# Patient Record
Sex: Female | Born: 1965 | Race: White | Hispanic: No | Marital: Single | Smoking: Current every day smoker
Health system: Southern US, Community
[De-identification: ages and names within clinical notes are randomized; demographics above are authoritative.]

## PROBLEM LIST (undated history)

## (undated) DIAGNOSIS — Z803 Family history of malignant neoplasm of breast: Secondary | ICD-10-CM

## (undated) HISTORY — PX: NO PAST SURGERIES: SHX2092

## (undated) HISTORY — DX: Family history of malignant neoplasm of breast: Z80.3

---

## 2004-07-01 ENCOUNTER — Ambulatory Visit: Payer: Self-pay | Admitting: Family Medicine

## 2005-08-18 ENCOUNTER — Ambulatory Visit: Payer: Self-pay | Admitting: Family Medicine

## 2005-09-04 ENCOUNTER — Ambulatory Visit: Payer: Self-pay | Admitting: Family Medicine

## 2007-06-03 ENCOUNTER — Ambulatory Visit: Payer: Self-pay | Admitting: Family Medicine

## 2008-10-24 ENCOUNTER — Ambulatory Visit: Payer: Self-pay | Admitting: Internal Medicine

## 2008-12-29 ENCOUNTER — Emergency Department: Payer: Self-pay | Admitting: Unknown Physician Specialty

## 2009-09-22 ENCOUNTER — Ambulatory Visit: Payer: Self-pay

## 2009-11-27 ENCOUNTER — Ambulatory Visit: Payer: Self-pay | Admitting: Internal Medicine

## 2010-10-25 ENCOUNTER — Ambulatory Visit: Payer: Self-pay

## 2010-10-27 ENCOUNTER — Ambulatory Visit: Payer: Self-pay

## 2011-05-01 ENCOUNTER — Ambulatory Visit: Payer: Self-pay

## 2011-11-27 ENCOUNTER — Ambulatory Visit: Payer: Self-pay | Admitting: Family Medicine

## 2012-12-05 ENCOUNTER — Ambulatory Visit: Payer: Self-pay | Admitting: Family Medicine

## 2013-01-08 ENCOUNTER — Ambulatory Visit: Payer: Self-pay

## 2013-01-20 ENCOUNTER — Ambulatory Visit: Payer: Self-pay

## 2014-02-04 ENCOUNTER — Ambulatory Visit: Payer: Self-pay

## 2015-02-01 ENCOUNTER — Ambulatory Visit
Admission: RE | Admit: 2015-02-01 | Discharge: 2015-02-01 | Disposition: A | Discharge status: Home / Self Care | Payer: Self-pay | Source: Ambulatory Visit | Attending: Oncology | Admitting: Oncology

## 2015-02-01 ENCOUNTER — Ambulatory Visit: Payer: Self-pay | Attending: Oncology

## 2015-02-01 VITALS — BP 123/84 | HR 66 | Temp 96.0°F | Resp 16 | Ht 68.11 in | Wt 191.4 lb

## 2015-02-01 DIAGNOSIS — Z0000 Encounter for general adult medical examination without abnormal findings: Secondary | ICD-10-CM

## 2015-02-01 NOTE — Progress Notes (Signed)
Subjective:     Patient ID: Diane Kennedy, female   DOB: [birth year], 49 y.o.   MRN: [id number]  HPI   Review of Systems     Objective:   Physical Exam  Pulmonary/Chest: Right breast exhibits no inverted nipple, no mass, no nipple discharge, no skin change and no tenderness. Left breast exhibits no inverted nipple, no mass, no nipple discharge, no skin change and no tenderness. Breasts are symmetrical.    Bilateral thickening upper outer quadrants.       Assessment:     49 year old patient presents for [facility] clinic visitPatient screened, and meets B[facility]CP eligibility.  Patient does not have insurance, Medicare or Medicaid.  Handout given on Affordable Care Act.CBE unremarkable.  Instructed patient on breast self-exam using teach back method.  Patient has 2 sisters with history of breast cancer that have tested negative for BRCA per patient.    Plan:  Sent for bilateral screening mammogram.

## 2015-02-02 ENCOUNTER — Other Ambulatory Visit: Payer: Self-pay

## 2015-02-02 DIAGNOSIS — N63 Unspecified lump in unspecified breast: Secondary | ICD-10-CM

## 2015-02-02 NOTE — Progress Notes (Signed)
Screening mammogram results Birads 0 right breast mass.  Orders for diagnostic mammogram right breast with ultrasound right breast.

## 2015-02-08 ENCOUNTER — Ambulatory Visit
Admission: RE | Admit: 2015-02-08 | Discharge: 2015-02-08 | Disposition: A | Discharge status: Home / Self Care | Payer: Self-pay | Source: Ambulatory Visit | Attending: Oncology | Admitting: Oncology

## 2015-02-08 ENCOUNTER — Ambulatory Visit: Admission: RE | Admit: 2015-02-08 | Payer: Self-pay | Source: Ambulatory Visit

## 2015-02-08 DIAGNOSIS — N63 Unspecified lump in unspecified breast: Secondary | ICD-10-CM

## 2015-02-08 NOTE — Progress Notes (Unsigned)
Patient ID: Diane Kennedy, female   DOB: [birth year], 49 y.o.   MRN: [id number] Letter mailed from [facility] to notify of normal mammogram results.  Patient to return in one year for annual screening.  Copy to [facility]IS.

## 2015-02-16 ENCOUNTER — Other Ambulatory Visit: Payer: Self-pay

## 2015-02-16 ENCOUNTER — Ambulatory Visit: Payer: Self-pay

## 2016-02-09 ENCOUNTER — Ambulatory Visit
Admission: RE | Admit: 2016-02-09 | Discharge: 2016-02-09 | Disposition: A | Discharge status: Home / Self Care | Payer: Self-pay | Source: Ambulatory Visit | Attending: Internal Medicine | Admitting: Internal Medicine

## 2016-02-09 ENCOUNTER — Ambulatory Visit: Payer: Self-pay | Attending: Internal Medicine

## 2016-02-09 VITALS — BP 130/78 | HR 78 | Temp 97.5°F | Ht 68.11 in | Wt 191.9 lb

## 2016-02-09 DIAGNOSIS — Z0000 Encounter for general adult medical examination without abnormal findings: Secondary | ICD-10-CM

## 2016-02-09 NOTE — Progress Notes (Signed)
Subjective:     Patient ID: Diane Kennedy, female   DOB: 08-30-1965, 50 y.o.   MRN: [id number]  HPI   Review of Systems     Objective:   Physical Exam  Pulmonary/Chest: Right breast exhibits no inverted nipple, no mass, no nipple discharge, no skin change and no tenderness. Left breast exhibits no inverted nipple, no mass, no nipple discharge, no skin change and no tenderness. Breasts are symmetrical.       Assessment:     50 year old patient presents for [facility] clinic visitPatient screened, and meets BCCCP eligibility.  Patient does not have insurance, Medicare or Medicaid.  Handout given on Affordable Care Act.Instructed patient on breast self-exam using teach back methodCBE unremarkable.  No mass or lump palpated.  Patient's partner passed away in 01-01-23.  She is tearful, and had difficulty being in [facility].  Support given.    Plan:     Sent for bilateral screening mammogram.

## 2016-03-02 NOTE — Progress Notes (Signed)
Letter mailed from [facility] to notify of normal mammogram results.  Patient to return in one year for annual screening.  Copy to [facility]IS. 

## 2017-03-03 ENCOUNTER — Ambulatory Visit
Admission: EM | Admit: 2017-03-03 | Discharge: 2017-03-03 | Disposition: A | Discharge status: Home / Self Care | Payer: Self-pay | Attending: Family Medicine | Admitting: Family Medicine

## 2017-03-03 DIAGNOSIS — H6503 Acute serous otitis media, bilateral: Secondary | ICD-10-CM

## 2017-03-03 DIAGNOSIS — J029 Acute pharyngitis, unspecified: Secondary | ICD-10-CM

## 2017-03-03 DIAGNOSIS — J020 Streptococcal pharyngitis: Secondary | ICD-10-CM

## 2017-03-03 LAB — RAPID STREP SCREEN (MED CTR MEBANE ONLY): Streptococcus, Group A Screen (Direct): POSITIVE — AB

## 2017-03-03 MED ORDER — LORATADINE-PSEUDOEPHEDRINE ER 10-240 MG PO TB24: 1.0000 | ORAL_TABLET | Freq: Every day | ORAL | 0 refills | Status: DC

## 2017-03-03 MED ORDER — PENICILLIN G BENZATHINE 1200000 UNIT/2ML IM SUSP
1.2000 10*6.[IU] | Freq: Once | INTRAMUSCULAR | Status: AC
  Administered 2017-03-03: 1.2 10*6.[IU] via INTRAMUSCULAR

## 2017-03-03 NOTE — ED Triage Notes (Addendum)
Pt with congestion starting last week and left ear pain. Ear has been popping. Sore throat started yesterday. Pain 5/10. Has had cough and drainage down back of throat. Also states "My eyeballs hurt."

## 2017-03-03 NOTE — ED Provider Notes (Signed)
[facility]    CSN: [id number] Arrival date & time: 03/03/17  4098     History   Chief Complaint Chief Complaint  Patient presents with  . Otalgia  . Sore Throat    HPI Diane Kennedy is a 51 y.o. female.   Patient is a 51 year old white female who comes in today complaining of ear pain bilaterally and sore throat. Everything was started about a week ago which she knows sore throat and nasal congestion she was also hoarse. She did not think much of it however during the weekends gradually progressed to this morning she had a lot more pain in her ears and that was much sore and so she finally relented and came in to be seen and evaluated. She does smoke. No known drug allergies. Denies any previous surgery history. No current medical history. She reports several cancers on both sides of the family cancers on both sides the family.   The history is provided by the patient. No language interpreter was used.  Otalgia  Location:  Bilateral Behind ear:  Swelling Quality:  Aching and burning Severity:  Moderate Onset quality:  Sudden Duration:  1 week Timing:  Constant Progression:  Worsening Chronicity:  New Context: recent URI   Relieved by:  Nothing Worsened by:  Nothing Ineffective treatments:  None tried Associated symptoms: congestion, cough and sore throat   Associated symptoms: no fever   Sore Throat     History reviewed. No pertinent past medical history.  There are no active problems to display for this patient.   History reviewed. No pertinent surgical history.  OB History    No data available       Home Medications    Prior to Admission medications   Not on File    Family History Family History  Problem Relation Age of Onset  . Breast cancer Sister 94  . Breast cancer Sister 81    Social History Social History  Substance Use Topics  . Smoking status: Current Every Day Smoker    Packs/day: 0.50    Years: 25.00    Types:  Cigarettes  . Smokeless tobacco: Never Used  . Alcohol use 0.0 oz/week     Comment: social     Allergies   Patient has no known allergies.   Review of Systems Review of Systems  Constitutional: Negative for fever.  HENT: Positive for congestion, ear pain and sore throat.   Respiratory: Positive for cough.   All other systems reviewed and are negative.    Physical Exam Triage Vital Signs ED Triage Vitals  Enc Vitals Group     BP 03/03/17 0832 (!) 149/86     Pulse Rate 03/03/17 0832 82     Resp 03/03/17 0832 18     Temp 03/03/17 0832 98.5 F (36.9 C)     Temp Source 03/03/17 0832 Oral     SpO2 03/03/17 0832 98 %     Weight 03/03/17 0830 165 lb (74.8 kg)     Height 03/03/17 0830 5\' 9"  (1.753 m)     Head Circumference --      Peak Flow --      Pain Score 03/03/17 0830 5     Pain Loc --      Pain Edu? --      Excl. in GC? --    No data found.   Updated Vital Signs BP (!) 149/86 (BP Location: Right Arm)   Pulse 82   Temp  98.5 F (36.9 C) (Oral)   Resp 18   Ht 5\' 9"  (1.753 m)   Wt 165 lb (74.8 kg)   LMP 01/20/2017   SpO2 98%   BMI 24.37 kg/m   Visual Acuity Right Eye Distance:   Left Eye Distance:   Bilateral Distance:    Right Eye Near:   Left Eye Near:    Bilateral Near:     Physical Exam  Constitutional: She is oriented to person, place, and time. She appears well-developed and well-nourished.  HENT:  Head: Normocephalic and atraumatic.  Right Ear: Hearing, external ear and ear canal normal. Tympanic membrane is erythematous and bulging.  Left Ear: Hearing, external ear and ear canal normal. Tympanic membrane is erythematous.  Nose: Mucosal edema present. Right sinus exhibits no maxillary sinus tenderness and no frontal sinus tenderness. Left sinus exhibits no maxillary sinus tenderness and no frontal sinus tenderness.  Mouth/Throat: Uvula is midline, oropharynx is clear and moist and mucous membranes are normal. No uvula swelling.  Eyes: Pupils  are equal, round, and reactive to light. Conjunctivae and EOM are normal.  Neck: Neck supple.  Pulmonary/Chest: Effort normal.  Musculoskeletal: Normal range of motion.  Lymphadenopathy:    She has cervical adenopathy.  Neurological: She is alert and oriented to person, place, and time. No cranial nerve deficit.  Skin: Skin is warm and dry.  Psychiatric: She has a normal mood and affect.  Vitals reviewed.    UC Treatments / Results  Labs (all labs ordered are listed, but only abnormal results are displayed) Labs Reviewed  RAPID STREP SCREEN (NOT AT [facility])    EKG  EKG Interpretation None       Radiology No results found.  Procedures Procedures (including critical care time)  Medications Ordered in UC Medications - No data to display  Results for orders placed or performed during the hospital encounter of 03/03/17  Rapid strep screen  Result Value Ref Range   Streptococcus, Group A Screen (Direct) POSITIVE (A) NEGATIVE   Initial Impression / Assessment and Plan / UC Course  I have reviewed the triage vital signs and the nursing notes.  Pertinent labs & imaging results that were available during my care of the patient were reviewed by me and considered in my medical decision making (see chart for details).   patient strep test was positive offered and she accepted LA Bicillin 1.2 million units IM will give a work note for today and tomorrow Claritin-D for congestion.   Final Clinical Impressions(s) / UC Diagnoses   Final diagnoses:  Bilateral acute serous otitis media, recurrence not specified  Acute pharyngitis, unspecified etiology    New Prescriptions New Prescriptions   No medications on file   Note: This dictation was prepared with Dragon dictation along with smaller phrase technology. Any transcriptional errors that result from this process are unintentional. Controlled Substance Prescriptions [place] Controlled Substance Registry consulted? Not Applicable    Hassan Rowan, MD 03/03/17 [phone]

## 2017-03-06 ENCOUNTER — Telehealth: Payer: Self-pay

## 2017-03-06 MED ORDER — AZITHROMYCIN 250 MG PO TABS: 250.0000 mg | ORAL_TABLET | Freq: Every day | ORAL | 0 refills | Status: DC

## 2017-03-06 NOTE — Telephone Encounter (Signed)
Patient called in this morning stating that she was not improving on current antibiotic. Patient would like to have additional called in. Spoke with Dr. Hassan Rowan, MD which advised that we could send in a Zpack to her pharmacy which I have done so.

## 2017-04-23 ENCOUNTER — Ambulatory Visit: Payer: Self-pay | Attending: Oncology | Admitting: *Deleted

## 2017-04-23 ENCOUNTER — Encounter (INDEPENDENT_AMBULATORY_CARE_PROVIDER_SITE_OTHER): Payer: Self-pay

## 2017-04-23 ENCOUNTER — Ambulatory Visit
Admission: RE | Admit: 2017-04-23 | Discharge: 2017-04-23 | Disposition: A | Discharge status: Home / Self Care | Payer: Self-pay | Source: Ambulatory Visit | Attending: Oncology | Admitting: Oncology

## 2017-04-23 ENCOUNTER — Encounter: Payer: Self-pay | Admitting: *Deleted

## 2017-04-23 VITALS — BP 173/92 | HR 81 | Temp 96.7°F | Wt 167.0 lb

## 2017-04-23 DIAGNOSIS — Z0000 Encounter for general adult medical examination without abnormal findings: Secondary | ICD-10-CM

## 2017-04-23 NOTE — Patient Instructions (Signed)
Gave patient hand-out, Women Staying Healthy, Active and Well from [facility], with education on breast health, pap smears, heart and colon health. 

## 2017-04-23 NOTE — Progress Notes (Signed)
Subjective:     Patient ID: Diane Kennedy, female   DOB: 1965-12-25, 51 y.o.   MRN: [id number]  HPI   Review of Systems     Objective:   Physical Exam  Pulmonary/Chest: Right breast exhibits no inverted nipple, no mass, no nipple discharge, no skin change and no tenderness. Left breast exhibits no inverted nipple, no mass, no nipple discharge, no skin change and no tenderness. Breasts are symmetrical.         Assessment:     51 year old White female returns to [facility] for annual screening.  Clinical breast exam with thickening noted at bilateral breast.  Taught self breast awareness.  Patient with significant family history of cancer.  She has 2 sisters with breast cancer, 1 sister with uterine cancer, 1 brother with lung cancer, and 1 brother with multiple cancers.  States the 3 sisters had genetic testing and were negative.  Stressed importance of annual screening.  Also encouraged her to talk with her primary care about genetic testing for herself if she is interested.  Blood pressure elevated at 173/92.  She is very anxious.  States she cried when she pulled up to the cancer center.  Her partner had been a patient here, and died a year ago.   She is to recheck her blood pressure at [facility] or [facility], and if remains higher than 140/90 she is to follow-up with her primary care provider.  Patient has been screened for eligibility.  She does not have any insurance, Medicare or Medicaid.  She also meets financial eligibility.  Hand-out given on the Affordable Care Act.    Plan:     Screening mammogram ordered.  Will follow-up per [facility] protocol.

## 2017-04-23 NOTE — Progress Notes (Signed)
Letter mailed from [facility] to inform patient of her normal mammogram results.  Patient is to follow-up with annual screening in one year.  [facility] to [facility]. 

## 2018-04-29 ENCOUNTER — Other Ambulatory Visit: Payer: Self-pay

## 2018-04-29 ENCOUNTER — Encounter (INDEPENDENT_AMBULATORY_CARE_PROVIDER_SITE_OTHER): Payer: Self-pay

## 2018-04-29 ENCOUNTER — Ambulatory Visit: Payer: Self-pay | Attending: Oncology

## 2018-04-29 ENCOUNTER — Ambulatory Visit
Admission: RE | Admit: 2018-04-29 | Discharge: 2018-04-29 | Disposition: A | Discharge status: Home / Self Care | Payer: Self-pay | Source: Ambulatory Visit | Attending: Oncology | Admitting: Oncology

## 2018-04-29 VITALS — BP 148/82 | HR 75 | Temp 98.3°F | Ht 68.0 in | Wt 188.0 lb

## 2018-04-29 DIAGNOSIS — Z0000 Encounter for general adult medical examination without abnormal findings: Secondary | ICD-10-CM

## 2018-04-29 NOTE — Progress Notes (Signed)
  Subjective:     Patient ID: Diane Kennedy, female   DOB: 11-30-1965, 52 y.o.   MRN: [id number]  HPI   Review of Systems     Objective:   Physical Exam  Pulmonary/Chest: Right breast exhibits no inverted nipple, no mass, no nipple discharge, no skin change and no tenderness. Left breast exhibits no inverted nipple, no mass, no nipple discharge, no skin change and no tenderness. Breasts are symmetrical.  Genitourinary: There is no rash, tenderness, lesion or injury on the right labia. There is lesion on the left labia. There is no rash, tenderness or injury on the left labia. Cervix exhibits no motion tenderness, no discharge and no friability. Right adnexum displays no mass, no tenderness and no fullness. Left adnexum displays no mass, no tenderness and no fullness. No erythema, tenderness or bleeding in the vagina. No foreign body in the vagina. No signs of injury around the vagina. Vaginal discharge found.  Genitourinary Comments: White flat lesion lower left labia. Thick white non odorous vaginal discharge       Assessment:     52 year old patient presents for [facility] clinic visit.  Patient screened, and meets BCCCP eligibility.  Patient does not have insurance, Medicare or Medicaid.  Handout given on Affordable Care Act.  Instructed patient on breast self awareness using teach back method.   Clinical breast exam unremarkable. No mass or lump palpated.  Pelvic exam reveals a flat white lesion left lower labia.  Patient states it is not painful.  Reports it comes and goes.  Informed patient she would need to see a GYN about this area.  Visit would not be covered through BCCCP.      Plan:     Sent for bilateral screening mammogram.  Specimen collected for pap.

## 2018-05-02 LAB — PAP LB AND HPV HIGH-RISK
HPV, HIGH-RISK: POSITIVE — AB
PAP Smear Comment: 0

## 2018-05-13 NOTE — Progress Notes (Signed)
Letter mailed to patient to notify of normal mammogram, and negative/positive HPV pap smear results. Mailed information on HPV. Patient will need a repeat pap in one year per ASCCP guidelines. Copy to [facility]IS.

## 2019-05-07 ENCOUNTER — Ambulatory Visit
Admission: EM | Admit: 2019-05-07 | Discharge: 2019-05-07 | Disposition: A | Discharge status: Home / Self Care | Payer: Self-pay | Attending: Family Medicine | Admitting: Family Medicine

## 2019-05-07 ENCOUNTER — Other Ambulatory Visit: Payer: Self-pay

## 2019-05-07 ENCOUNTER — Encounter: Payer: Self-pay | Admitting: Emergency Medicine

## 2019-05-07 DIAGNOSIS — R0981 Nasal congestion: Secondary | ICD-10-CM

## 2019-05-07 DIAGNOSIS — F17210 Nicotine dependence, cigarettes, uncomplicated: Secondary | ICD-10-CM

## 2019-05-07 DIAGNOSIS — R519 Headache, unspecified: Secondary | ICD-10-CM

## 2019-05-07 DIAGNOSIS — R05 Cough: Secondary | ICD-10-CM

## 2019-05-07 DIAGNOSIS — J069 Acute upper respiratory infection, unspecified: Secondary | ICD-10-CM

## 2019-05-07 DIAGNOSIS — Z7189 Other specified counseling: Secondary | ICD-10-CM

## 2019-05-07 MED ORDER — HYDROCOD POLST-CPM POLST ER 10-8 MG/5ML PO SUER: 5.0000 mL | Freq: Every evening | ORAL | 0 refills | Status: DC | PRN

## 2019-05-07 NOTE — ED Triage Notes (Signed)
Patient in today with a 3 day history of sinus congestion, cough, headache and fever this morning of 99.6. Patient has been taking Mucinex without relief.

## 2019-05-07 NOTE — Discharge Instructions (Addendum)
Take medication as prescribed. Rest. Drink plenty of fluids. Over the counter decongestants and mucinex.  Follow up with your primary care physician this week as needed. Return to Urgent care for new or worsening concerns.

## 2019-05-07 NOTE — ED Provider Notes (Signed)
[facility] ____________________________________________  Time seen: Approximately 4:06 PM  I have reviewed the triage vital signs and the nursing notes.   HISTORY  Chief Complaint Sinus Problem, Cough, and Headache   HPI Diane Kennedy is a 53 y.o. female presented for evaluation of 3 days of runny nose, nasal congestion, sinus pressure and cough.  States sinus pressure is bothering her with associated postnasal drainage.  States postnasal drainage particularly at night is causing cough that is disrupting her sleep.  States T-max 99.6.  States her boyfriend sick with similar complaints.  Denies current sore throat.  Has tried over-the-counter Mucinex and ibuprofen with minimal improvement.  Continues to eat and drink well.  Slight decrease in taste.  Denies vomiting, diarrhea, chest pain shortness of breath.  Denies recent sickness.  Reports otherwise doing well.   History reviewed. No pertinent past medical history.  There are no active problems to display for this patient.   Past Surgical History:  Procedure Laterality Date  . NO PAST SURGERIES       No current facility-administered medications for this encounter.   Current Outpatient Medications:  .  chlorpheniramine-HYDROcodone (TUSSIONEX PENNKINETIC ER) 10-8 MG/5ML SUER, Take 5 mLs by mouth at bedtime as needed for cough. do not drive or operate machinery while taking as can cause drowsiness., Disp: 40 mL, Rfl: 0  Allergies Patient has no known allergies.  Family History  Problem Relation Age of Onset  . Breast cancer Sister 69  . Breast cancer Sister 54  . Alzheimer's disease Mother   . Diabetes Father   . Hypertension Father   . Kidney cancer Father     Social History Social History   Tobacco Use  . Smoking status: Current Every Day Smoker    Packs/day: 0.50    Years: 25.00    Pack years: 12.50    Types: Cigarettes  . Smokeless tobacco: Never Used  Substance Use Topics  . Alcohol use: Yes     Alcohol/week: 0.0 standard drinks    Comment: social  . Drug use: No    Review of Systems Constitutional: No fever ENT: No sore throat. As above.  Cardiovascular: Denies chest pain. Respiratory: Denies shortness of breath. Gastrointestinal: No abdominal pain.   Musculoskeletal: Negative for back pain. Skin: Negative for rash.   ____________________________________________   PHYSICAL EXAM:  VITAL SIGNS: ED Triage Vitals  Enc Vitals Group     BP 05/07/19 1532 (!) 140/93     Pulse Rate 05/07/19 1532 88     Resp 05/07/19 1532 18     Temp 05/07/19 1532 98 F (36.7 C)     Temp Source 05/07/19 1532 Oral     SpO2 05/07/19 1532 98 %     Weight 05/07/19 1533 195 lb (88.5 kg)     Height 05/07/19 1533 5\' 9"  (1.753 m)     Head Circumference --      Peak Flow --      Pain Score 05/07/19 1532 8     Pain Loc --      Pain Edu? --      Excl. in GC? --    Constitutional: Alert and oriented. Well appearing and in no acute distress. Eyes: Conjunctivae are normal.  Head: Atraumatic.Mild tenderness to palpation bilateral frontal and maxillary sinuses. No swelling. No erythema.   Ears: no erythema, normal TMs bilaterally.   Nose: nasal congestion with bilateral nasal turbinate erythema and edema.   Mouth/Throat: Mucous membranes are moist.  Oropharynx  non-erythematous.No tonsillar swelling or exudate.  Neck: No stridor.  No cervical spine tenderness to palpation. Hematological/Lymphatic/Immunilogical: No cervical lymphadenopathy. Cardiovascular: Normal rate, regular rhythm. Grossly normal heart sounds.  Good peripheral circulation. Respiratory: Normal respiratory effort.  No retractions. No wheezes, rales or rhonchi. Good air movement.  Musculoskeletal: Steady gait.  Neurologic:  Normal speech and language. No gait instability. Skin:  Skin is warm, dry and intact. No rash noted. Psychiatric: Mood and affect are normal. Speech and behavior are normal.  ___________________________________________   LABS (all labs ordered are listed, but only abnormal results are displayed)  Labs Reviewed  NOVEL CORONAVIRUS, NAA (HOSP ORDER, SEND-OUT TO REF LAB; TAT 18-24 HRS)    PROCEDURES Procedures   INITIAL IMPRESSION / ASSESSMENT AND PLAN / ED COURSE  Pertinent labs & imaging results that were available during my care of the patient were reviewed by me and considered in my medical decision making (see chart for details).  Well-appearing patient.  No acute distress.  Suspect viral upper respiratory infection.  COVID-19 testing completed and advice given.  Encourage rest, fluids, supportive care, over-the-counter Mucinex and decongestants.  Rx as needed Tussionex as needed.  Supportive care.Discussed indication, risks and benefits of medications with patient.  Discussed follow up with Primary care physician this week as needed. Discussed follow up and return parameters including no resolution or any worsening concerns. Patient verbalized understanding and agreed to plan.   ____________________________________________   FINAL CLINICAL IMPRESSION(S) / ED DIAGNOSES  Final diagnoses:  Upper respiratory tract infection, unspecified type  Advice given about COVID-19 virus infection     ED Discharge Orders         Ordered    chlorpheniramine-HYDROcodone (TUSSIONEX PENNKINETIC ER) 10-8 MG/5ML SUER  At bedtime PRN     05/07/19 1556           Note: This dictation was prepared with Dragon dictation along with smaller phrase technology. Any transcriptional errors that result from this process are unintentional.         Marylene Land, NP 05/07/19 1612

## 2019-05-08 LAB — NOVEL CORONAVIRUS, NAA (HOSP ORDER, SEND-OUT TO REF LAB; TAT 18-24 HRS): SARS-CoV-2, NAA: NOT DETECTED

## 2019-10-01 ENCOUNTER — Other Ambulatory Visit: Payer: Self-pay

## 2019-10-01 ENCOUNTER — Encounter: Payer: Self-pay | Admitting: *Deleted

## 2019-10-01 ENCOUNTER — Ambulatory Visit: Payer: Self-pay | Attending: Oncology | Admitting: *Deleted

## 2019-10-01 ENCOUNTER — Ambulatory Visit
Admission: RE | Admit: 2019-10-01 | Discharge: 2019-10-01 | Disposition: A | Discharge status: Home / Self Care | Payer: Self-pay | Source: Ambulatory Visit | Attending: Oncology | Admitting: Oncology

## 2019-10-01 VITALS — BP 129/84 | HR 78 | Temp 96.1°F | Ht 69.0 in | Wt 201.9 lb

## 2019-10-01 DIAGNOSIS — Z0000 Encounter for general adult medical examination without abnormal findings: Secondary | ICD-10-CM | POA: Insufficient documentation

## 2019-10-01 NOTE — Progress Notes (Signed)
  Subjective:     Patient ID: Diane Kennedy, female   DOB: Jun 22, 1966, 54 y.o.   MRN: [id number]  HPI   Review of Systems     Objective:   Physical Exam Chest:     Breasts: Breasts are asymmetrical.        Right: No swelling, bleeding, inverted nipple, mass, nipple discharge, skin change or tenderness.        Left: No swelling, bleeding, inverted nipple, mass, nipple discharge, skin change or tenderness.     Comments: Right breast larger than the left Abdominal:     Palpations: There is no hepatomegaly or splenomegaly.  Genitourinary:    Exam position: Lithotomy position.     Labia:        Right: No rash, tenderness, lesion or injury.        Left: No rash, tenderness, lesion or injury.      Vagina: No signs of injury and foreign body. Vaginal discharge present. No erythema, tenderness, bleeding, lesions or prolapsed vaginal walls.     Cervix: No cervical motion tenderness, discharge, friability or lesion.     Uterus: Not deviated.      Adnexa:        Right: No mass or tenderness.         Left: No mass, tenderness or fullness.       Rectum: Mass present.        Comments: White non-odorous cottage cheese like discharge noted Lymphadenopathy:     Upper Body:     Right upper body: No supraclavicular or axillary adenopathy.     Left upper body: No supraclavicular adenopathy.        Assessment:     54 year old White female returns to [facility] for annual screening.  Clinical breast exam unremarkable.  Taught self breast awareness.  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  Specimen collected for pap per ASCCP guidelines.  Patient has been screened for eligibility.  She does not have any insurance, Medicare or Medicaid.  She also meets financial eligibility.   Risk Assessment    Risk Scores      10/01/2019 04/29/2018   Last edited by: Scarlett Presto, RN Scarlett Presto, RN   5-year risk: 3.5 % 1.8 %   Lifetime risk: 27.3 % 17.1 %         Patient is considered high  risk for breast cancer due to family history.  Coralee Rud, RN discussed referral to the high risk breast clinic for consultation and possible genetic testing, but patient refused at this time.    Plan:     Screening mammogram ordered.  Specimen for pap sent to the lab.

## 2019-10-01 NOTE — Patient Instructions (Signed)
Gave patient hand-out, Women Staying Healthy, Active and Well from [facility], with education on breast health, pap smears, heart and colon health. 

## 2019-10-07 LAB — IGP, APTIMA HPV: HPV Aptima: POSITIVE — AB

## 2019-10-16 ENCOUNTER — Encounter: Payer: Self-pay | Admitting: *Deleted

## 2019-10-16 NOTE — Progress Notes (Signed)
Called and left patient a message to return my call.  I would like to review her pap results and need for referral for colposocopy.

## 2019-10-16 NOTE — Progress Notes (Signed)
Patient returned my call.  Reviewed pap and need for colposcopy and possible biopsy.  She is scheduled for 11/04/19 @ 8:00 with Dr. Logan Bores.

## 2019-11-04 ENCOUNTER — Ambulatory Visit (INDEPENDENT_AMBULATORY_CARE_PROVIDER_SITE_OTHER): Payer: Self-pay | Admitting: Obstetrics and Gynecology

## 2019-11-04 ENCOUNTER — Other Ambulatory Visit: Payer: Self-pay

## 2019-11-04 ENCOUNTER — Encounter: Payer: Self-pay | Admitting: Obstetrics and Gynecology

## 2019-11-04 ENCOUNTER — Other Ambulatory Visit: Payer: Self-pay | Admitting: Obstetrics and Gynecology

## 2019-11-04 VITALS — BP 129/86 | HR 80 | Ht 69.0 in | Wt 200.6 lb

## 2019-11-04 DIAGNOSIS — N72 Inflammatory disease of cervix uteri: Secondary | ICD-10-CM

## 2019-11-04 DIAGNOSIS — B977 Papillomavirus as the cause of diseases classified elsewhere: Secondary | ICD-10-CM

## 2019-11-04 NOTE — Addendum Note (Signed)
Addended by: Dorian Pod on: 11/04/2019 01:31 PM   Modules accepted: Orders

## 2019-11-04 NOTE — Progress Notes (Signed)
   HPI:  Diane Kennedy is a 54 y.o.  No obstetric history on file.  who presents today for evaluation and management of abnormal cervical cytology.    Dysplasia History:   2019 HPV positive cytology negative 2021 HPV positive cytology negative   ROS:  Pertinent items are noted in HPI.  Patient does smoke cigarettes.  OB History  No obstetric history on file.    History reviewed. No pertinent past medical history.  Past Surgical History:  Procedure Laterality Date  . NO PAST SURGERIES      SOCIAL HISTORY: Social History   Substance and Sexual Activity  Alcohol Use Yes  . Alcohol/week: 0.0 standard drinks   Comment: social   Social History   Substance and Sexual Activity  Drug Use No     Family History  Problem Relation Age of Onset  . Breast cancer Sister 69  . Breast cancer Sister 28  . Alzheimer's disease Mother   . Diabetes Father   . Hypertension Father   . Kidney cancer Father     ALLERGIES:  Patient has no known allergies.  She currently has no medications in their medication list.  Physical Exam: -Vitals:  BP 129/86   Pulse 80   Ht 5\' 9"  (1.753 m)   Wt 200 lb 9.6 oz (91 kg)   LMP 01/20/2017   BMI 29.62 kg/m   PROCEDURE: Colposcopy performed with 4% acetic acid after verbal consent obtained                           -Aceto-white Lesions Location(s): See above              -Biopsy performed at 6 and 12 o'clock               -ECC indicated and performed: No.     -Biopsy sites made hemostatic with pressure and Monsel's solution   -Satisfactory colposcopy: Yes.      -Evidence of Invasive cervical CA :  NO  ASSESSMENT:  Diane Kennedy is a 54 y.o. No obstetric history on file. here for  1. High risk human papilloma virus (HPV) infection of cervix      Cytology negative  PLAN: 1.  I discussed the grading system of pap smears and HPV high risk viral types.  We will discuss management after colpo results return. 2.  The effect of smoking on the  immune system and possible effect on cervical cancer risk was discussed with the patient in detail.  No orders of the defined types were placed in this encounter.          F/U  Return in about 2 weeks (around 11/18/2019) for Colpo f/u. I spent 16 minutes involved in the care of this patient preparing to see the patient by obtaining and reviewing her medical history (including labs, imaging tests and prior procedures), documenting clinical information in the electronic health record (EHR), counseling and coordinating care plans, writing and sending prescriptions, ordering tests or procedures and directly communicating with the patient by discussing pertinent items from her history and physical exam as well as detailing my assessment and plan as noted above so that she has an informed understanding of cytology and HPV findings on Pap smear.  All of her questions were answered.  01/18/2020 ,MD 11/04/2019,8:43 AM

## 2019-11-08 LAB — ANATOMIC PATHOLOGY REPORT

## 2019-11-20 ENCOUNTER — Encounter: Payer: Self-pay | Admitting: Obstetrics and Gynecology

## 2019-11-20 ENCOUNTER — Ambulatory Visit (INDEPENDENT_AMBULATORY_CARE_PROVIDER_SITE_OTHER): Payer: Self-pay | Admitting: Obstetrics and Gynecology

## 2019-11-20 ENCOUNTER — Other Ambulatory Visit: Payer: Self-pay

## 2019-11-20 VITALS — BP 152/89 | HR 80 | Ht 68.0 in | Wt 198.2 lb

## 2019-11-20 DIAGNOSIS — B977 Papillomavirus as the cause of diseases classified elsewhere: Secondary | ICD-10-CM

## 2019-11-20 DIAGNOSIS — N72 Inflammatory disease of cervix uteri: Secondary | ICD-10-CM

## 2019-11-20 NOTE — Progress Notes (Signed)
HPI:      [name] is a 54 y.o. No obstetric history on file. who LMP was Patient's last menstrual period was 01/20/2017.  Subjective:   She presents today for follow-up of her colposcopy.  She has previously had HPV and no cytologic abnormalities on her Pap smear. Of significant note patient continues to smoke tobacco each day.    Hx: The following portions of the patient's history were reviewed and updated as appropriate:             She  has no past medical history on file. She does not have a problem list on file. She  has a past surgical history that includes No past surgeries. Her family history includes Alzheimer's disease in her mother; Breast cancer (age of onset: 41) in her sister; Breast cancer (age of onset: 33) in her sister; Diabetes in her father; Hypertension in her father; Kidney cancer in her father. She  reports that she has been smoking cigarettes. She has a 12.50 pack-year smoking history. She has never used smokeless tobacco. She reports current alcohol use. She reports that she does not use drugs. She currently has no medications in their medication list. She has No Known Allergies.       Review of Systems:  Review of Systems  Constitutional: Denied constitutional symptoms, night sweats, recent illness, fatigue, fever, insomnia and weight loss.  Eyes: Denied eye symptoms, eye pain, photophobia, vision change and visual disturbance.  Ears/Nose/Throat/Neck: Denied ear, nose, throat or neck symptoms, hearing loss, nasal discharge, sinus congestion and sore throat.  Cardiovascular: Denied cardiovascular symptoms, arrhythmia, chest pain/pressure, edema, exercise intolerance, orthopnea and palpitations.  Respiratory: Denied pulmonary symptoms, asthma, pleuritic pain, productive sputum, cough, dyspnea and wheezing.  Gastrointestinal: Denied, gastro-esophageal reflux, melena, nausea and vomiting.  Genitourinary: Denied genitourinary symptoms including symptomatic  vaginal discharge, pelvic relaxation issues, and urinary complaints.  Musculoskeletal: Denied musculoskeletal symptoms, stiffness, swelling, muscle weakness and myalgia.  Dermatologic: Denied dermatology symptoms, rash and scar.  Neurologic: Denied neurology symptoms, dizziness, headache, neck pain and syncope.  Psychiatric: Denied psychiatric symptoms, anxiety and depression.  Endocrine: Denied endocrine symptoms including hot flashes and night sweats.   Meds:   No current outpatient medications on file prior to visit.   No current facility-administered medications on file prior to visit.    Objective:     Vitals:   11/20/19 0950  BP: (!) 152/89  Pulse: 80              Colposcopy and biopsies confirm no cytologic abnormalities  Assessment:    No obstetric history on file. There are no problems to display for this patient.    1. High risk human papilloma virus (HPV) infection of cervix     Without cytologic abnormality   Plan:            1.  Discussed the natural course and history of HPV and its effect on cytology.  All questions answered.  I have recommended a follow-up Pap smear with her annual examination.  Cotesting done at that Pap as well.  2.  Advised discontinuation of tobacco use.  Discussed the effect of tobacco use and decreased immune function and how it affects HPV. Orders No orders of the defined types were placed in this encounter.   No orders of the defined types were placed in this encounter.     F/U  Return in about 1 year (around 11/19/2020) for Annual Physical. I spent 22 minutes involved  in the care of this patient preparing to see the patient by obtaining and reviewing her medical history (including labs, imaging tests and prior procedures), documenting clinical information in the electronic health record (EHR), counseling and coordinating care plans, writing and sending prescriptions, ordering tests or procedures and directly communicating with  the patient by discussing pertinent items from her history and physical exam as well as detailing my assessment and plan as noted above so that she has an informed understanding.  All of her questions were answered.  Finis Bud, M.D. 11/20/2019 10:01 AM

## 2019-11-27 ENCOUNTER — Encounter: Payer: Self-pay | Admitting: *Deleted

## 2019-11-27 NOTE — Progress Notes (Signed)
Letter mailed to inform patient of her next pap on 10/13/20.  [facility]IS to [place].

## 2020-04-24 ENCOUNTER — Other Ambulatory Visit: Payer: Self-pay

## 2020-04-24 ENCOUNTER — Ambulatory Visit
Admission: EM | Admit: 2020-04-24 | Discharge: 2020-04-24 | Disposition: A | Discharge status: Home / Self Care | Payer: Self-pay | Attending: Family Medicine | Admitting: Family Medicine

## 2020-04-24 DIAGNOSIS — L0201 Cutaneous abscess of face: Secondary | ICD-10-CM

## 2020-04-24 MED ORDER — MUPIROCIN 2 % EX OINT: 1.0000 "application " | TOPICAL_OINTMENT | Freq: Two times a day (BID) | CUTANEOUS | 0 refills | Status: AC

## 2020-04-24 MED ORDER — DOXYCYCLINE HYCLATE 100 MG PO CAPS: 100.0000 mg | ORAL_CAPSULE | Freq: Two times a day (BID) | ORAL | 0 refills | Status: DC

## 2020-04-24 NOTE — Discharge Instructions (Signed)
Warm compresses.  Medication as prescribed.  If you fail to improve or worsen, go to the ER.  Take care  Dr. Adriana Simas

## 2020-04-24 NOTE — ED Triage Notes (Signed)
Pt states abscess on chin started out Thursday as a pimple. Popped it but nothing came out except clear liquid. Today a small amount of pus came out.

## 2020-04-24 NOTE — ED Provider Notes (Signed)
[facility]    CSN: [id number] Arrival date & time: 04/24/20  1432  History   Chief Complaint Chief Complaint  Patient presents with  . Abscess   HPI  54 year old female presents with an area of concern on her chin.   Patient states that this appeared to be a zit. Noted on Thursday. She "popped" it. Subsequently worsened. Now is redness, swollen and painful. Pain 9/10 in severity. No relieving factors. No fever. No other complaints.   Past Surgical History:  Procedure Laterality Date  . NO PAST SURGERIES      OB History   No obstetric history on file.      Home Medications    Prior to Admission medications   Medication Sig Start Date End Date Taking? Authorizing Provider  doxycycline (VIBRAMYCIN) 100 MG capsule Take 1 capsule (100 mg total) by mouth 2 (two) times daily. 04/24/20   Tommie Sams, DO  mupirocin ointment (BACTROBAN) 2 % Apply 1 application topically 2 (two) times daily for 7 days. 04/24/20 05/01/20  Tommie Sams, DO    Family History Family History  Problem Relation Age of Onset  . Breast cancer Sister 24  . Breast cancer Sister 56  . Alzheimer's disease Mother   . Diabetes Father   . Hypertension Father   . Kidney cancer Father     Social History Social History   Tobacco Use  . Smoking status: Current Every Day Smoker    Packs/day: 0.50    Years: 25.00    Pack years: 12.50    Types: Cigarettes  . Smokeless tobacco: Never Used  Vaping Use  . Vaping Use: Never used  Substance Use Topics  . Alcohol use: Yes    Alcohol/week: 0.0 standard drinks    Comment: social  . Drug use: No     Allergies   Patient has no known allergies.   Review of Systems Review of Systems Per HPI  Physical Exam Triage Vital Signs ED Triage Vitals  Enc Vitals Group     BP 04/24/20 1445 136/88     Pulse Rate 04/24/20 1445 72     Resp 04/24/20 1445 18     Temp 04/24/20 1445 98.3 F (36.8 C)     Temp Source 04/24/20 1445 Oral     SpO2  04/24/20 1445 100 %     Weight 04/24/20 1442 190 lb (86.2 kg)     Height 04/24/20 1442 5\' 9"  (1.753 m)     Head Circumference --      Peak Flow --      Pain Score 04/24/20 1442 9     Pain Loc --      Pain Edu? --      Excl. in GC? --    No data found.  Updated Vital Signs BP 136/88 (BP Location: Left Arm)   Pulse 72   Temp 98.3 F (36.8 C) (Oral)   Resp 18   Ht 5\' 9"  (1.753 m)   Wt 86.2 kg   LMP 01/20/2017   SpO2 100%   BMI 28.06 kg/m   Visual Acuity Right Eye Distance:   Left Eye Distance:   Bilateral Distance:    Right Eye Near:   Left Eye Near:    Bilateral Near:     Physical Exam Vitals reviewed.  Constitutional:      General: She is not in acute distress.    Appearance: Normal appearance. She is not ill-appearing.  HENT:  Head: Normocephalic and atraumatic.      Comments: Erythematous area with induration noted at the labelled location.  Cardiovascular:     Rate and Rhythm: Normal rate and regular rhythm.  Pulmonary:     Effort: Pulmonary effort is normal. No respiratory distress.  Neurological:     Mental Status: She is alert.  Psychiatric:        Mood and Affect: Mood normal.        Behavior: Behavior normal.      UC Treatments / Results  Labs (all labs ordered are listed, but only abnormal results are displayed) Labs Reviewed - No data to display  EKG   Radiology No results found.  Procedures Procedures (including critical care time)  Medications Ordered in UC Medications - No data to display  Initial Impression / Assessment and Plan / UC Course  I have reviewed the triage vital signs and the nursing notes.  Pertinent labs & imaging results that were available during my care of the patient were reviewed by me and considered in my medical decision making (see chart for details).    54 year old female presents with a facial abscess. Given location, would like to avoid I & D and subsequent scarring.  Placing on doxy and bactoban.  Warm compresses.   Final Clinical Impressions(s) / UC Diagnoses   Final diagnoses:  Facial abscess     Discharge Instructions     Warm compresses.  Medication as prescribed.  If you fail to improve or worsen, go to the ER.  Take care  Dr. Adriana Simas    ED Prescriptions    Medication Sig Dispense Auth. Provider   mupirocin ointment (BACTROBAN) 2 % Apply 1 application topically 2 (two) times daily for 7 days. 22 g Lehrke, Analiah Drum G, DO   doxycycline (VIBRAMYCIN) 100 MG capsule Take 1 capsule (100 mg total) by mouth 2 (two) times daily. 20 capsule Tommie Sams, DO     PDMP not reviewed this encounter.   Chrystel, [place], DO 04/24/20 2250

## 2020-10-13 ENCOUNTER — Ambulatory Visit: Payer: Self-pay | Attending: Oncology

## 2020-11-03 ENCOUNTER — Other Ambulatory Visit: Payer: Self-pay

## 2020-11-03 ENCOUNTER — Ambulatory Visit
Admission: RE | Admit: 2020-11-03 | Discharge: 2020-11-03 | Disposition: A | Discharge status: Home / Self Care | Payer: Self-pay | Source: Ambulatory Visit | Attending: Oncology | Admitting: Oncology

## 2020-11-03 ENCOUNTER — Ambulatory Visit: Payer: Self-pay | Attending: Oncology | Admitting: *Deleted

## 2020-11-03 VITALS — BP 146/94 | HR 76 | Temp 97.7°F | Ht 69.0 in | Wt 190.3 lb

## 2020-11-03 DIAGNOSIS — Z0000 Encounter for general adult medical examination without abnormal findings: Secondary | ICD-10-CM

## 2020-11-03 DIAGNOSIS — Z9189 Other specified personal risk factors, not elsewhere classified: Secondary | ICD-10-CM

## 2020-11-03 NOTE — Progress Notes (Signed)
  Subjective:     Patient ID: Diane Kennedy, female   DOB: 1965/10/16, 55 y.o.   MRN: [id number]  HPI       Review of Systems     Objective:   Physical Exam Chest:  Breasts:     Right: No swelling, bleeding, inverted nipple, mass, nipple discharge, skin change, tenderness, axillary adenopathy or supraclavicular adenopathy.     Left: No swelling, bleeding, inverted nipple, mass, nipple discharge, skin change, tenderness, axillary adenopathy or supraclavicular adenopathy.    Abdominal:     Palpations: There is no hepatomegaly or splenomegaly.  Genitourinary:    Exam position: Lithotomy position.     Labia:        Right: No rash, tenderness, lesion or injury.        Left: No rash, tenderness or injury.      Urethra: No prolapse.     Vagina: No signs of injury and foreign body. Vaginal discharge present. No erythema, tenderness, bleeding, lesions or prolapsed vaginal walls.     Cervix: No cervical motion tenderness, discharge, friability, lesion, erythema, cervical bleeding or eversion.     Uterus: Not deviated.      Adnexa:        Right: No mass.         Left: No mass.       Rectum: No mass.     Comments: White non-ordorous discharge noted Lymphadenopathy:     Upper Body:     Right upper body: No supraclavicular or axillary adenopathy.     Left upper body: No supraclavicular or axillary adenopathy.        Assessment:     55 year old female returns to [facility] for annual screening.  Patient with history of colposcopy on 11/04/19 with benign biopsy.  She had a history of negative / HPV positive pap smears on 04/29/18 and 10/01/19.  ASCCP guidelines recommends pap in one year.  Specimen collected for pap smear without difficulty.  Clinical breast exam unremarkable.  Taught self breast awareness.  Patient has been screened for eligibility.  She does not have any insurance, Medicare or Medicaid.  She also meets financial eligibility.   Risk Assessment   No risk assessment data for  the current encounter  Risk Scores      10/01/2019   Last edited by: Scarlett Presto, RN   5-year risk: 3.5 %   Lifetime risk: 27.3 %          .       Plan:     Screening mammogram ordered.  Specimen for pap sent to the lab.  Discussed high risk for breast cancer based on family history and [facility] of 27.3%.  Patient is agreeable to be seen by medical oncology and possible genetic counselor.  Referral placed for high risk clinic.   Will follow up per BCCCP protocol.

## 2020-11-08 LAB — IGP, APTIMA HPV: HPV Aptima: POSITIVE — AB

## 2020-11-12 ENCOUNTER — Encounter: Payer: Self-pay | Admitting: Internal Medicine

## 2020-11-12 ENCOUNTER — Telehealth: Payer: Self-pay | Admitting: Obstetrics and Gynecology

## 2020-11-12 ENCOUNTER — Inpatient Hospital Stay: Payer: Self-pay

## 2020-11-12 ENCOUNTER — Inpatient Hospital Stay: Payer: Self-pay | Attending: Internal Medicine | Admitting: Internal Medicine

## 2020-11-12 ENCOUNTER — Other Ambulatory Visit: Payer: Self-pay

## 2020-11-12 DIAGNOSIS — F17210 Nicotine dependence, cigarettes, uncomplicated: Secondary | ICD-10-CM | POA: Insufficient documentation

## 2020-11-12 DIAGNOSIS — Z1501 Genetic susceptibility to malignant neoplasm of breast: Secondary | ICD-10-CM | POA: Insufficient documentation

## 2020-11-12 DIAGNOSIS — Z803 Family history of malignant neoplasm of breast: Secondary | ICD-10-CM | POA: Insufficient documentation

## 2020-11-12 DIAGNOSIS — Z9189 Other specified personal risk factors, not elsewhere classified: Secondary | ICD-10-CM | POA: Insufficient documentation

## 2020-11-12 DIAGNOSIS — Z801 Family history of malignant neoplasm of trachea, bronchus and lung: Secondary | ICD-10-CM | POA: Insufficient documentation

## 2020-11-12 DIAGNOSIS — Z8041 Family history of malignant neoplasm of ovary: Secondary | ICD-10-CM

## 2020-11-12 DIAGNOSIS — Z8051 Family history of malignant neoplasm of kidney: Secondary | ICD-10-CM | POA: Insufficient documentation

## 2020-11-12 DIAGNOSIS — Z833 Family history of diabetes mellitus: Secondary | ICD-10-CM | POA: Insufficient documentation

## 2020-11-12 NOTE — Progress Notes (Signed)
[facility] CONSULT NOTE  Patient Care Team: Duanne Limerick, MD as PCP - General (Family Medicine)  CHIEF COMPLAINTS/PURPOSE OF CONSULTATION: high risk for Breast cancer  #  Oncology History   No history exists.     HISTORY OF PRESENTING ILLNESS:  Diane Kennedy 55 y.o.  female female with no prior history of breast cancer/recently evaluated at the breast clinic/and was noted to have elevated risk of breast cancer; referred to [place] for further evaluation recommendations.  Given the family history of breast cancer-patient considered high risk for development of breast cancer.   Patient smokes.  Denies any history of COPD CAD.    Last edited by: Scarlett Presto, RN    5-year risk: 3.5 %   Lifetime risk: 27.3 %        Review of Systems  Constitutional: Negative for chills, diaphoresis, fever, malaise/fatigue and weight loss.  HENT: Negative for nosebleeds and sore throat.   Eyes: Negative for double vision.  Respiratory: Negative for cough, hemoptysis, sputum production, shortness of breath and wheezing.   Cardiovascular: Negative for chest pain, palpitations, orthopnea and leg swelling.  Gastrointestinal: Negative for abdominal pain, blood in stool, constipation, diarrhea, heartburn, melena, nausea and vomiting.  Genitourinary: Negative for dysuria, frequency and urgency.  Musculoskeletal: Negative for back pain and joint pain.  Skin: Negative.  Negative for itching and rash.  Neurological: Negative for dizziness, tingling, focal weakness, weakness and headaches.  Endo/Heme/Allergies: Does not bruise/bleed easily.  Psychiatric/Behavioral: Negative for depression. The patient is not nervous/anxious and does not have insomnia.      MEDICAL HISTORY:  Past Medical History:  Diagnosis Date  . Family history of breast cancer     SURGICAL HISTORY: Past Surgical History:  Procedure Laterality Date  . NO PAST SURGERIES      SOCIAL HISTORY: Social History    Socioeconomic History  . Marital status: Single    Spouse name: Not on file  . Number of children: Not on file  . Years of education: Not on file  . Highest education level: Not on file  Occupational History  . Not on file  Tobacco Use  . Smoking status: Current Every Day Smoker    Packs/day: 0.50    Years: 30.00    Pack years: 15.00    Types: Cigarettes  . Smokeless tobacco: Never Used  Vaping Use  . Vaping Use: Never used  Substance and Sexual Activity  . Alcohol use: Yes    Alcohol/week: 0.0 standard drinks    Comment: social  . Drug use: No  . Sexual activity: Not on file  Other Topics Concern  . Not on file  Social History Narrative   Lives in [place]; work at [employer] in [employer]; smoke 1ppd; rare alcohol.    Social Determinants of Health   Financial Resource Strain: Not on file  Food Insecurity: Not on file  Transportation Needs: Not on file  Physical Activity: Not on file  Stress: Not on file  Social Connections: Not on file  Intimate Partner Violence: Not on file    FAMILY HISTORY: Family History  Problem Relation Age of Onset  . Breast cancer Sister 60  . Breast cancer Sister 58  . Alzheimer's disease Mother   . Diabetes Father   . Hypertension Father   . Kidney cancer Father   . Lung cancer Brother 54       mets to brain, liver, bone, adrenal gland & pancreas  ALLERGIES:  has No Known Allergies.  MEDICATIONS:  Current Outpatient Medications  Medication Sig Dispense Refill  . loratadine-pseudoephedrine (CLARITIN-D 24-HOUR) 10-240 MG 24 hr tablet Take 1 tablet by mouth daily at 12 noon.     No current facility-administered medications for this visit.      Marland Kitchen  PHYSICAL EXAMINATION: ECOG PERFORMANCE STATUS: 0 - Asymptomatic  Vitals:   11/12/20 1110  BP: (!) 143/107  Pulse: 85  Resp: 16  Temp: (!) 96.9 F (36.1 C)  SpO2: 100%   Filed Weights   11/12/20 1110  Weight: 190 lb 6.4 oz (86.4 kg)    Physical  Exam HENT:     Head: Normocephalic and atraumatic.     Mouth/Throat:     Pharynx: No oropharyngeal exudate.  Eyes:     Pupils: Pupils are equal, round, and reactive to light.  Cardiovascular:     Rate and Rhythm: Normal rate and regular rhythm.  Pulmonary:     Effort: No respiratory distress.     Breath sounds: No wheezing.  Abdominal:     General: Bowel sounds are normal. There is no distension.     Palpations: Abdomen is soft. There is no mass.     Tenderness: There is no abdominal tenderness. There is no guarding or rebound.  Musculoskeletal:        General: No tenderness. Normal range of motion.     Cervical back: Normal range of motion and neck supple.  Skin:    General: Skin is warm.  Neurological:     Mental Status: She is alert and oriented to person, place, and time.  Psychiatric:        Mood and Affect: Affect normal.      LABORATORY DATA:  I have reviewed the data as listed No results found for: WBC, HGB, HCT, MCV, PLT No results for input(s): NA, K, CL, CO2, GLUCOSE, BUN, CREATININE, CALCIUM, GFRNONAA, GFRAA, PROT, ALBUMIN, AST, ALT, ALKPHOS, BILITOT, BILIDIR, IBILI in the last 8760 hours.  RADIOGRAPHIC STUDIES: I have personally reviewed the radiological images as listed and agreed with the findings in the report. MS DIGITAL SCREENING TOMO BILATERAL  Result Date: 11/03/2020 CLINICAL DATA:  Screening. EXAM: DIGITAL SCREENING BILATERAL MAMMOGRAM WITH TOMOSYNTHESIS AND CAD TECHNIQUE: Bilateral screening digital craniocaudal and mediolateral oblique mammograms were obtained. Bilateral screening digital breast tomosynthesis was performed. The images were evaluated with computer-aided detection. COMPARISON:  Previous exam(s). ACR Breast Density Category b: There are scattered areas of fibroglandular density. FINDINGS: There are no findings suspicious for malignancy. The images were evaluated with computer-aided detection. IMPRESSION: No mammographic evidence of  malignancy. A result letter of this screening mammogram will be mailed directly to the patient. RECOMMENDATION: Screening mammogram in one year. (Code:SM-B-01Y) BI-RADS CATEGORY  1: Negative. Electronically Signed   By: Emmaline Kluver M.D.   On: 11/03/2020 12:03    ASSESSMENT & PLAN:   At high risk for breast cancer # HIGH RISK of BREAST CANCER- As per IBIS-risk calculation patient's risk calculated-anywhere between-20% lifetime risk; with 7% risk over the next 10 years.  # Discussed the role of tamoxifen in risk reduction of development of breast cancer.  I reviewed the data of using tamoxifen 20 mg a day for 5 years.  Studies have shown that tamoxifen reduces the risk of development of breast cancer by approximately 50%.   # Long discussion regarding the potential adverse events on tamoxifen including but not limited to hot flashes, mood swings, thromboembolic events strokes and also small  risk of uterine cancers.     #Had a long discussion with the patient regarding incorporation of healthy lifestyle which includes #moderation of alcohol #abstinence of smoking #maintaining healthy BMI#exercise 150-300 minutes of moderate intensity exercise/week  #After the lengthy discussion patient declines tamoxifen [2 of her friends had a severe adverse events]   #I also reviewed cancer screening: a] colonoscopy at 47 years b] annual PAP smear c] continue mammograms on an annual basis  # Smoking: Discussed with the patient regarding the ill effects of smoking- including but not limited to cardiac lung and vascular diseases and malignancies. Counseled against smoking.   # GENETICS: Given the patient's family history of breast cancer-discussed genetics testing-patient declines/financial concerns.  Thank you, Ms Coralee Rud Molli Posey for allowing me to participate in the care of your pleasant patient. Please do not hesitate to contact me with questions or concerns in the interim. Will continue to  follow up with [facility].   # DISPOSITION: # follow up as needed-Dr.B  # 45 minutes face-to-face with the patient discussing the above plan of care; more than 50% of time spent on prognosis/ natural history; counseling and coordination.   All questions were answered. The patient/family knows to call the clinic with any problems, questions or concerns.    Earna Coder, MD 11/12/2020 1:19 PM

## 2020-11-12 NOTE — Telephone Encounter (Signed)
Pt recently had pap with positive HPV results- pt has hx of positive results and had COLPO 11-04-2019, PCP reccommended pt consult Dr. Logan Bores to see if repeat COLPO is necessary . Pt asked for a return call at [phone]. Please Advise.

## 2020-11-12 NOTE — Telephone Encounter (Signed)
Diane Kennedy,   Patient called in to the office today about possibly scheduling colpo. Would you like me to schedule her?

## 2020-11-12 NOTE — Assessment & Plan Note (Addendum)
#   HIGH RISK of BREAST CANCER- As per IBIS-risk calculation patient's risk calculated-anywhere between-20% lifetime risk; with 7% risk over the next 10 years.  # Discussed the role of tamoxifen in risk reduction of development of breast cancer.  I reviewed the data of using tamoxifen 20 mg a day for 5 years.  Studies have shown that tamoxifen reduces the risk of development of breast cancer by approximately 50%.   # Long discussion regarding the potential adverse events on tamoxifen including but not limited to hot flashes, mood swings, thromboembolic events strokes and also small risk of uterine cancers.     #Had a long discussion with the patient regarding incorporation of healthy lifestyle which includes #moderation of alcohol #abstinence of smoking #maintaining healthy BMI#exercise 150-300 minutes of moderate intensity exercise/week  #After the lengthy discussion patient declines tamoxifen [2 of her friends had a severe adverse events]   #I also reviewed cancer screening: a] colonoscopy at 47 years b] annual PAP smear c] continue mammograms on an annual basis  # Smoking: Discussed with the patient regarding the ill effects of smoking- including but not limited to cardiac lung and vascular diseases and malignancies. Counseled against smoking.   # GENETICS: Given the patient's family history of breast cancer-discussed genetics testing-patient declines/financial concerns.  Thank you, Ms Coralee Rud Molli Posey for allowing me to participate in the care of your pleasant patient. Please do not hesitate to contact me with questions or concerns in the interim. Will continue to follow up with [facility].   # DISPOSITION: # follow up as needed-Dr.B  # 45 minutes face-to-face with the patient discussing the above plan of care; more than 50% of time spent on prognosis/ natural history; counseling and coordination.

## 2020-11-18 NOTE — Telephone Encounter (Signed)
Yes a follow-up Pap smear in 1 year is appropriate very ASCCP guidelines for negative cytology but positive HPV

## 2020-11-24 ENCOUNTER — Encounter: Payer: Self-pay | Admitting: *Deleted

## 2020-11-24 NOTE — Progress Notes (Signed)
Called patient to discuss her mammogram and pap results, and that she does not need a colposcopy at this time.  Reviewed that per ASCCP guidelines and Dr. Logan Bores, patient will need repeat pap smear in one year along with her annual mammogram.  Patient states she saw Dr. Donneta Romberg for consult for her high risk score for breast cancer.  Patient has chosen not to pursue genetic testing, breast MRI's or the use of prophylactic Tamoxifen.  Stressed importance for patient to have annual screening mammograms.  She is agreeable.

## 2022-01-05 IMAGING — MG MM DIGITAL SCREENING BILAT W/ TOMO AND CAD
8 series · 8 of 24 positions shown · non-contrast
Comparison: Previous exam(s).

CLINICAL DATA: Screening.

EXAM:
DIGITAL SCREENING BILATERAL MAMMOGRAM WITH TOMOSYNTHESIS AND CAD
TECHNIQUE: Bilateral screening digital craniocaudal and mediolateral oblique
mammograms were obtained. Bilateral screening digital breast
tomosynthesis was performed. The images were evaluated with
computer-aided detection.

[L CC synth-2D]
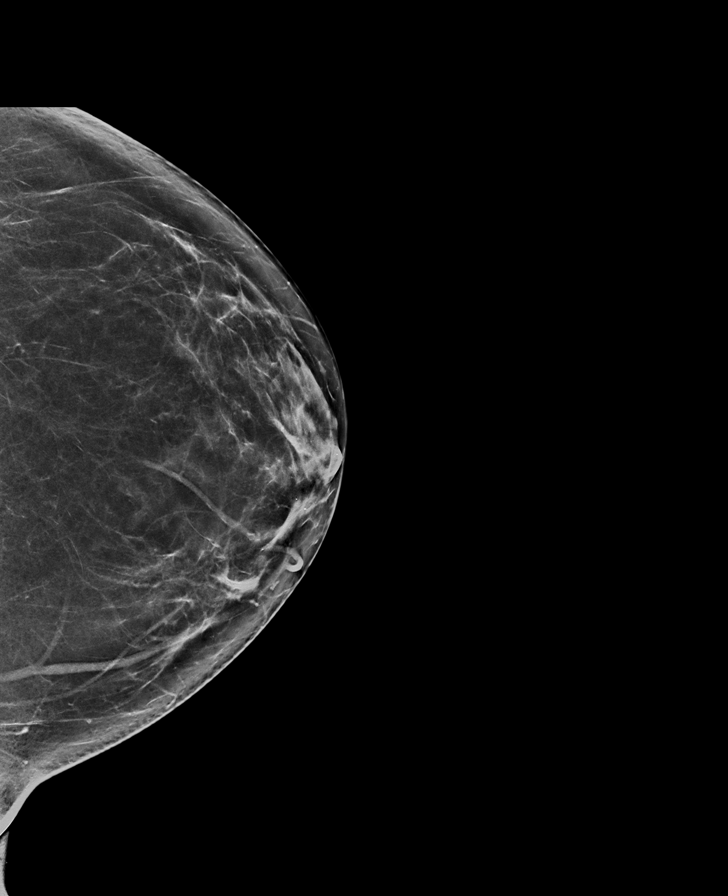

[L MLO synth-2D]
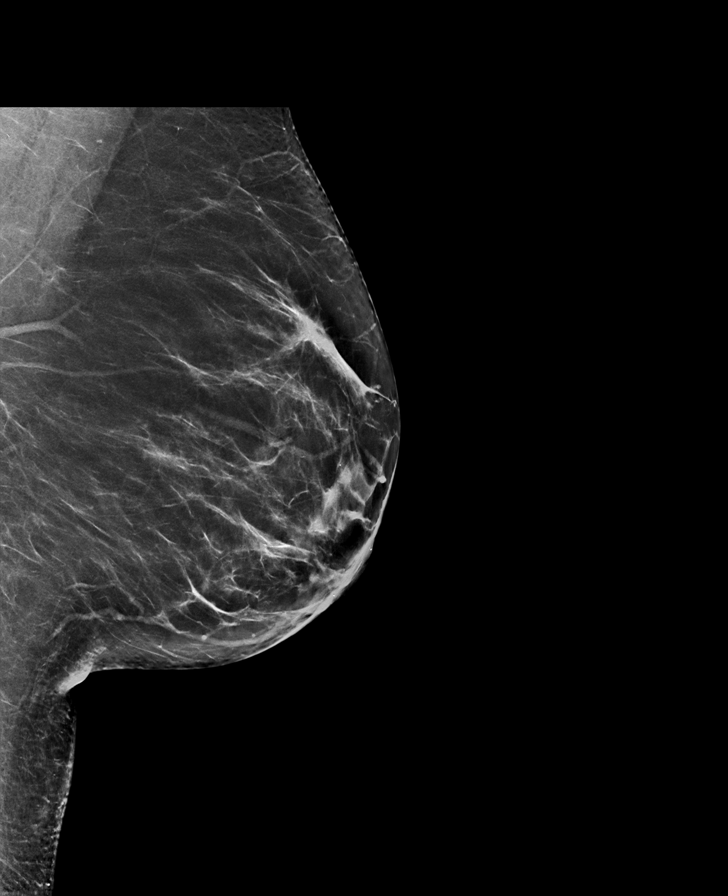

[R CC synth-2D]
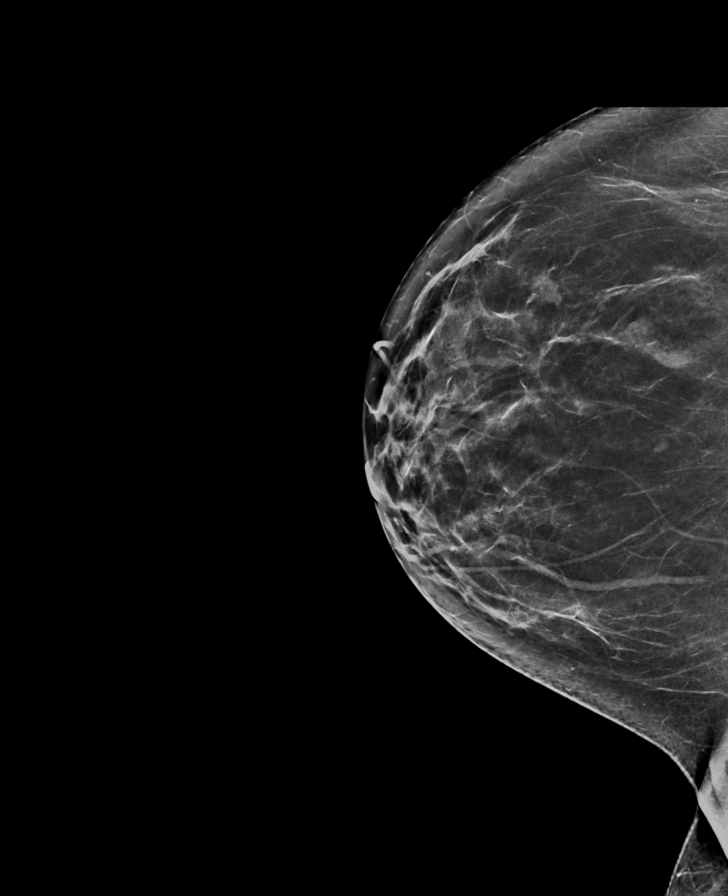

[R MLO synth-2D]
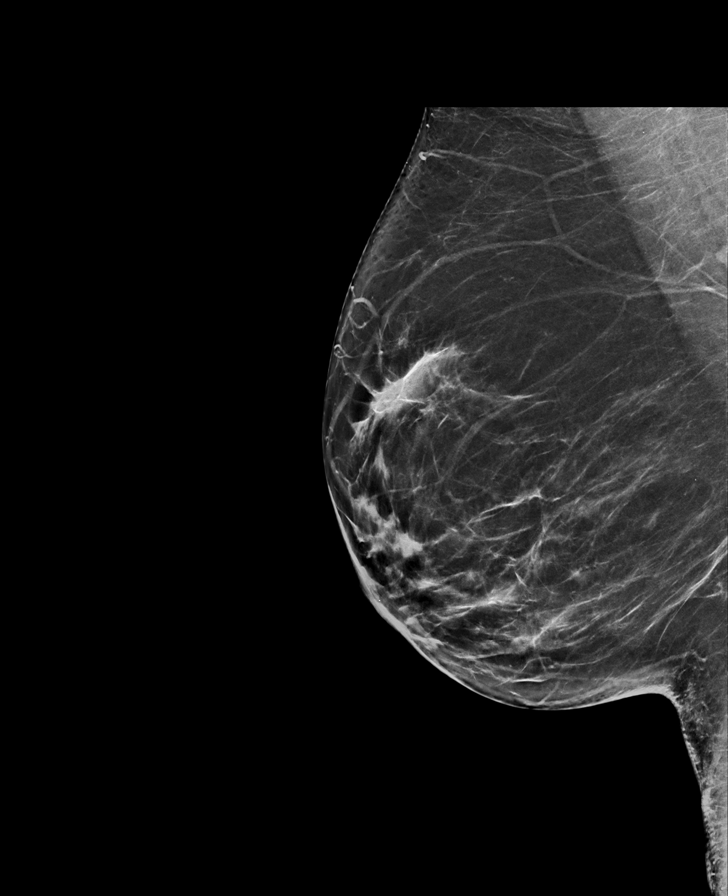

[R MLO tomo · tomo slice 41/80.0]
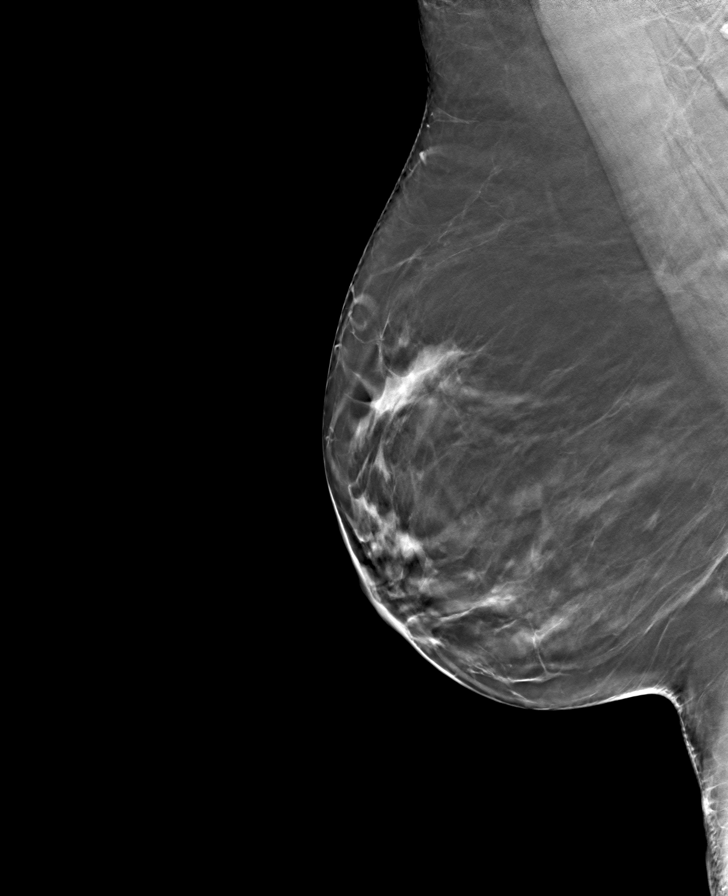

[L MLO tomo · tomo slice 41/81.0]
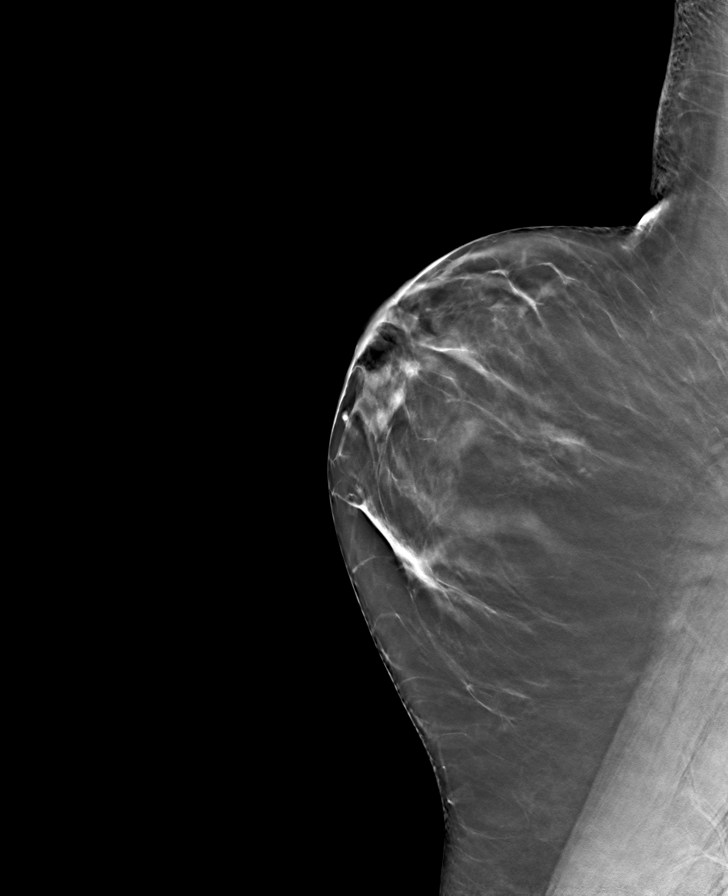

[L CC tomo · tomo slice 38/75.0]
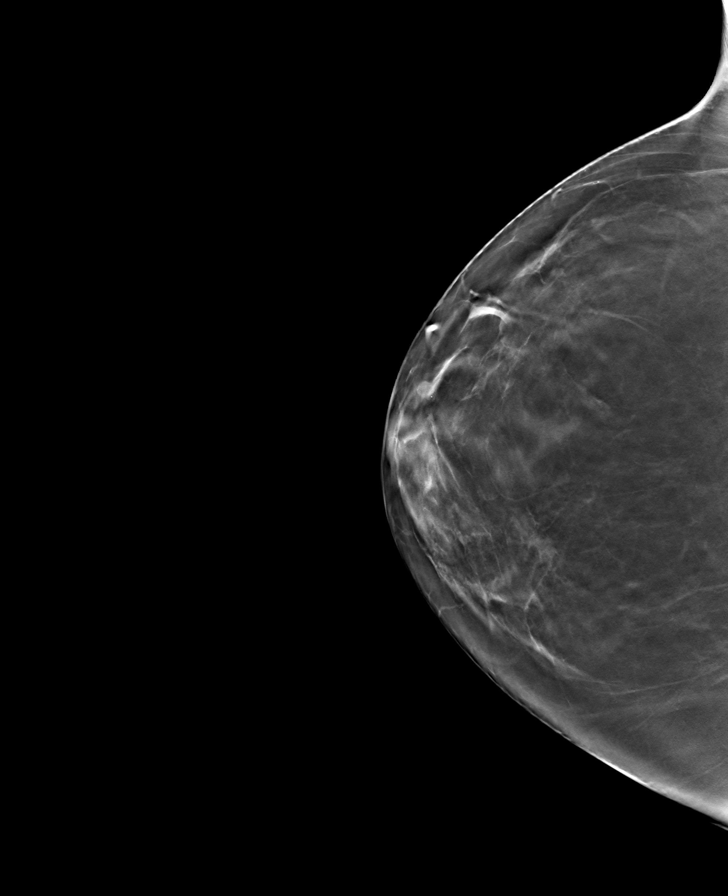

[R CC tomo · tomo slice 39/76.0]
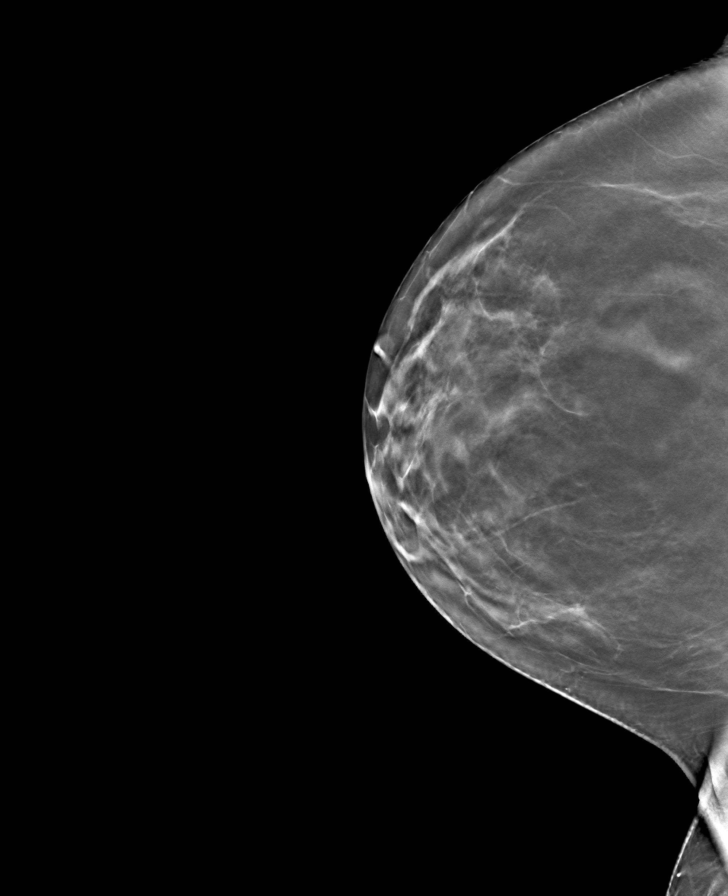

[8 of 24 positions shown; findings below may reference images not displayed]

ACR Breast Density Category b: There are scattered areas of
fibroglandular density.
FINDINGS: There are no findings suspicious for malignancy. The images were
evaluated with computer-aided detection.
IMPRESSION: No mammographic evidence of malignancy. A result letter of this
screening mammogram will be mailed directly to the patient.

RECOMMENDATION:
Screening mammogram in one year. (Code:WJ-I-BG6)

BI-RADS CATEGORY  1: Negative.

## 2022-02-20 ENCOUNTER — Other Ambulatory Visit: Payer: Self-pay

## 2022-02-20 DIAGNOSIS — Z1231 Encounter for screening mammogram for malignant neoplasm of breast: Secondary | ICD-10-CM

## 2022-02-21 ENCOUNTER — Ambulatory Visit: Payer: Self-pay

## 2022-02-21 ENCOUNTER — Other Ambulatory Visit: Payer: Self-pay

## 2022-02-21 ENCOUNTER — Ambulatory Visit: Payer: Self-pay | Attending: Hematology and Oncology | Admitting: *Deleted

## 2022-02-21 VITALS — BP 137/73 | Wt 158.8 lb

## 2022-02-21 DIAGNOSIS — Z124 Encounter for screening for malignant neoplasm of cervix: Secondary | ICD-10-CM

## 2022-02-21 DIAGNOSIS — Z1211 Encounter for screening for malignant neoplasm of colon: Secondary | ICD-10-CM

## 2022-02-21 DIAGNOSIS — N6459 Other signs and symptoms in breast: Secondary | ICD-10-CM

## 2022-02-21 NOTE — Progress Notes (Deleted)
  Subjective:     Patient ID: Diane Kennedy, female   DOB: 1965/10/19, 56 y.o.   MRN: [id number]  HPI   Review of Systems     Objective:   Physical Exam Exam conducted with a chaperone present.  Chest:  Breasts:    Right: No swelling, bleeding, inverted nipple, mass, nipple discharge, skin change or tenderness.     Left: No swelling, bleeding, inverted nipple, mass, nipple discharge, skin change or tenderness.    Abdominal:     Palpations: There is no hepatomegaly or splenomegaly.  Genitourinary:    Exam position: Lithotomy position.     Labia:        Right: No rash, tenderness, lesion or injury.        Left: No rash, tenderness, lesion or injury.      Urethra: No prolapse or urethral pain.     Vagina: No signs of injury and foreign body. Vaginal discharge present. No erythema, tenderness, bleeding, lesions or prolapsed vaginal walls.     Cervix: Erythema present. No cervical motion tenderness, discharge, friability, lesion, cervical bleeding or eversion.     Uterus: Not deviated and not enlarged.      Adnexa:        Right: No mass.         Left: No mass.       Rectum: No mass.        Comments: White non odorous thick discharge noted on exam - pt denies any vaginal itching, burning or discharge Lymphadenopathy:     Upper Body:     Right upper body: No supraclavicular or axillary adenopathy.     Left upper body: No supraclavicular or axillary adenopathy.      Assessment:     ***    Plan:     ***

## 2022-02-21 NOTE — Progress Notes (Signed)
[name] is a 56 y.o. No obstetric history on file. female who presents to [facility] clinic today with no complaints. Patient presents for clinical breast exam, pap and mammogram.  Patient has a history of abnormal paps over the last several years.  04/29/18 - / HPV +, 10/01/19 -/HPV+, colposcopy on 11/04/19 was negative, pap on 11/03/20 was negative/ HPV+.      Pap Smear: Pap smear completed today. Last Pap smear was 11/03/20 at [facility] clinic and was  negative / HPV+ . Per ASCCP guidelines and Dr. Logan Bores a 1 year follow up pap smear was recommended.   Per patient has history of an abnormal Pap smear. Last Pap smear result is available in Epic.   Physical exam: Breasts Exam conducted with a chaperone present.  Chest:  Breasts:    Right: No swelling, bleeding, inverted nipple, mass, nipple discharge, skin change or tenderness.     Left: No swelling, bleeding, inverted nipple, mass, nipple discharge, skin change or tenderness.     Abdominal:     Palpations: There is no hepatomegaly or splenomegaly.  Genitourinary:    Exam position: Lithotomy position.     Labia:        Right: No rash, tenderness, lesion or injury.        Left: No rash, tenderness, lesion or injury.      Urethra: No prolapse or urethral pain.     Vagina: No signs of injury and foreign body. Vaginal discharge present. No erythema, tenderness, bleeding, lesions or prolapsed vaginal walls.     Cervix: Erythema present. No cervical motion tenderness, discharge, friability, lesion, cervical bleeding or eversion.     Uterus: Not deviated and not enlarged.      Adnexa:        Right: No mass.         Left: No mass.       Rectum: No mass.          Comments: White non odorous thick discharge noted on exam - pt denies any vaginal itching, burning or discharge Lymphadenopathy:     Upper Body:     Right upper body: No supraclavicular or axillary adenopathy.     Left upper body: No supraclavicular or axillary adenopathy.     Smoking History: Patient has is a current smoker at 1/2 packs per day.  Patient has been referred to quit line.    Patient Navigation: Patient education provided. Taught self breast awareness.  Access to services provided for patient through [facility] program. No interpreter provided. No transportation provided   Colorectal Cancer Screening: Per patient has never had colonoscopy completed . No complaints today. FIT test given with instructions.   Breast and Cervical Cancer Risk Assessment: Patient has family history of breast cancer in 2 sisters, no known genetic mutations, patient is considered high risk for breast cancer.  She has never had radiation treatment to the chest before age 73. Patient does not have history of cervical dysplasia, immunocompromised, or DES exposure in-utero.  Risk Assessment     Risk Scores       02/21/2022 11/03/2020   Last edited by: Narda Rutherford, LPN Scarlett Presto, RN   5-year risk: 3.8 % 3.7 %   Lifetime risk: 26.5 % 26.9 %            A: BCCCP exam with pap smear P: Patient is considered high risk for breast cancer based on family history and the [employer].  She  was referred to Dr. Donneta Romberg in 2022 to the high risk clinic.  Patient refused genetic testing and prophylactic Tamoxifen at that time.  Discussed high risk and need for a diagnostic mammogram based on clinical exam today.   Referred patient to the [facility] for a diagnostic mammogram.   Appointment to be scheduled for mammogram by Joellyn Quails.  Patient is aware.  Jim Like, RN 02/21/2022 2:22 PM

## 2022-02-24 LAB — CYTOLOGY - PAP
Comment: NEGATIVE
Diagnosis: NEGATIVE
Diagnosis: REACTIVE
High risk HPV: NEGATIVE

## 2022-02-27 ENCOUNTER — Telehealth: Payer: Self-pay

## 2022-02-27 MED ORDER — METRONIDAZOLE 500 MG PO TABS: 500.0000 mg | ORAL_TABLET | Freq: Two times a day (BID) | ORAL | 0 refills | Status: AC

## 2022-02-27 NOTE — Telephone Encounter (Signed)
Patient informed negative Pap/HPV results, and also revealed bacterial vaginitis, needs rx metronidazole (Flagyl) sent to pharmacy. Patient verbalized understanding, rx sent to [facility].

## 2022-03-10 ENCOUNTER — Other Ambulatory Visit: Payer: Self-pay

## 2022-03-31 ENCOUNTER — Other Ambulatory Visit: Payer: Self-pay

## 2022-03-31 ENCOUNTER — Inpatient Hospital Stay: Admission: RE | Admit: 2022-03-31 | Payer: Self-pay | Source: Ambulatory Visit

## 2022-04-28 ENCOUNTER — Other Ambulatory Visit: Payer: Self-pay

## 2022-05-10 ENCOUNTER — Ambulatory Visit
Admission: RE | Admit: 2022-05-10 | Discharge: 2022-05-10 | Disposition: A | Discharge status: Home / Self Care | Payer: Self-pay | Source: Ambulatory Visit | Attending: Obstetrics and Gynecology | Admitting: Obstetrics and Gynecology

## 2022-05-10 DIAGNOSIS — N6459 Other signs and symptoms in breast: Secondary | ICD-10-CM
